# Patient Record
Sex: Female | Born: 2000 | Hispanic: No | Marital: Single | Smoking: Never smoker
Health system: Southern US, Community
[De-identification: ages and names within clinical notes are randomized; demographics above are authoritative.]

## PROBLEM LIST (undated history)

## (undated) DIAGNOSIS — G039 Meningitis, unspecified: Secondary | ICD-10-CM

---
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
## 2021-03-30 ENCOUNTER — Encounter: Payer: Self-pay | Admitting: Family Medicine

## 2024-07-14 ENCOUNTER — Ambulatory Visit: Admitting: Family Medicine

## 2024-08-20 ENCOUNTER — Encounter: Payer: Self-pay | Admitting: Family Medicine

## 2024-08-20 ENCOUNTER — Ambulatory Visit: Admitting: Family Medicine

## 2024-08-20 VITALS — BP 122/84 | HR 70 | Ht 68.0 in | Wt 264.8 lb

## 2024-08-20 DIAGNOSIS — Z6841 Body Mass Index (BMI) 40.0 and over, adult: Secondary | ICD-10-CM

## 2024-08-20 DIAGNOSIS — Z124 Encounter for screening for malignant neoplasm of cervix: Secondary | ICD-10-CM

## 2024-08-20 DIAGNOSIS — E66813 Obesity, class 3: Secondary | ICD-10-CM | POA: Diagnosis not present

## 2024-08-20 DIAGNOSIS — Z7689 Persons encountering health services in other specified circumstances: Secondary | ICD-10-CM

## 2024-08-20 LAB — POCT GLYCOSYLATED HEMOGLOBIN (HGB A1C): Hemoglobin A1C: 5.3 % (ref 4.0–5.6)

## 2024-08-20 NOTE — Patient Instructions (Signed)
 Please look up how to count calories on youtube  Please

## 2024-08-20 NOTE — Progress Notes (Signed)
   Name: Candace [name]   Date of Visit: 08/20/24   Date of last visit with me: Visit date not found   CHIEF COMPLAINT:  Chief Complaint  Patient presents with   Establish Care    New patient.        HPI:  Discussed the use of AI scribe software for clinical note transcription with the patient, who gave verbal consent to proceed.  History of Present Illness   Candace [name] is a 23 year old female who presents for a routine check-up and weight management discussion.  She has not seen a pediatrician in a couple of years and is unsure about her past immunization history, including whether she received the HPV vaccine. She will fill out a medical release form to obtain her previous immunization records.  She has never had a Pap smear and is not currently sexually active. She has never had a Pap smear.  She is concerned about her weight. She weighs approximately 264 pounds and is 5 feet 7 inches tall. She is interested in losing weight naturally but is not opposed to considering medication if necessary. She has no current symptoms or specific concerns to report.         OBJECTIVE:       08/20/2024    8:58 AM  Depression screen PHQ 2/9  Decreased Interest 0  Down, Depressed, Hopeless 0  PHQ - 2 Score 0     BP Readings from Last 3 Encounters:  08/20/24 122/84  01/13/18 123/78 (89%, Z = 1.23 /  91%, Z = 1.34)*   *BP percentiles are based on the 2017 AAP Clinical Practice Guideline for girls    BP 122/84   Pulse 70   Ht 5' 8 (1.727 m)   Wt 264 lb 12.8 oz (120.1 kg)   SpO2 98%   BMI 40.26 kg/m    Physical Exam   MEASUREMENTS: Height- 5'7, Weight- 264.      Physical Exam  ASSESSMENT/PLAN:   Assessment & Plan Encounter to establish care with new doctor  Class 3 severe obesity due to excess calories without serious comorbidity with body mass index (BMI) of 40.0 to 44.9 in adult  Papanicolaou smear for cervical cancer screening    Assessment and Plan     Obesity, class 3 Class 3 obesity with significant excess weight. Prefers natural weight loss methods due to family history of medication issues. Discussed health risks of obesity and benefits of weight loss. Explained that benefits of weight loss medications outweigh risks, but she prefers natural methods first. - Order A1c test to assess blood glucose levels. - Educate on calorie counting and  - Advise maintaining a daily intake of 1800 calories to achieve weight loss. - Encourage increased physical activity, such as walking. - Plan to re-evaluate weight in 2-3 months.  Screening for cervical cancer She is 23 years old and has not yet had a Pap smear. Discussed importance of cervical cancer screening starting at age 42. - Send referral to OB-GYN for Pap smear screening.  General Health Maintenance Uncertain immunization history, specifically regarding the HPV vaccine. Discussed importance of obtaining immunization records to ensure up-to-date vaccinations. - Request immunization records from previous pediatrician. - Ensure completion of medical release form for record retrieval.         Jamelah Sitzer A. Vita MD [facility]

## 2024-11-19 ENCOUNTER — Ambulatory Visit: Payer: Self-pay | Admitting: Family Medicine

## 2024-11-20 ENCOUNTER — Encounter: Payer: Self-pay | Admitting: Family Medicine
